# Patient Record
Sex: Male | Born: 1975 | Race: White | Hispanic: No | Marital: Single | Smoking: Never smoker
Health system: Southern US, Community
[De-identification: ages and names within clinical notes are randomized; demographics above are authoritative.]

---

## 2016-10-18 ENCOUNTER — Encounter (HOSPITAL_BASED_OUTPATIENT_CLINIC_OR_DEPARTMENT_OTHER): Payer: Self-pay | Admitting: *Deleted

## 2016-10-18 ENCOUNTER — Emergency Department (HOSPITAL_BASED_OUTPATIENT_CLINIC_OR_DEPARTMENT_OTHER)
Admission: EM | Admit: 2016-10-18 | Discharge: 2016-10-18 | Disposition: A | Discharge status: Home / Self Care | Payer: BLUE CROSS/BLUE SHIELD | Attending: Emergency Medicine | Admitting: Emergency Medicine

## 2016-10-18 ENCOUNTER — Emergency Department (HOSPITAL_BASED_OUTPATIENT_CLINIC_OR_DEPARTMENT_OTHER): Payer: BLUE CROSS/BLUE SHIELD

## 2016-10-18 DIAGNOSIS — Y999 Unspecified external cause status: Secondary | ICD-10-CM | POA: Diagnosis not present

## 2016-10-18 DIAGNOSIS — S60012A Contusion of left thumb without damage to nail, initial encounter: Secondary | ICD-10-CM | POA: Insufficient documentation

## 2016-10-18 DIAGNOSIS — F17290 Nicotine dependence, other tobacco product, uncomplicated: Secondary | ICD-10-CM | POA: Diagnosis not present

## 2016-10-18 DIAGNOSIS — Z23 Encounter for immunization: Secondary | ICD-10-CM | POA: Diagnosis not present

## 2016-10-18 DIAGNOSIS — Y939 Activity, unspecified: Secondary | ICD-10-CM | POA: Insufficient documentation

## 2016-10-18 DIAGNOSIS — S61012A Laceration without foreign body of left thumb without damage to nail, initial encounter: Secondary | ICD-10-CM | POA: Diagnosis not present

## 2016-10-18 DIAGNOSIS — W231XXA Caught, crushed, jammed, or pinched between stationary objects, initial encounter: Secondary | ICD-10-CM | POA: Diagnosis not present

## 2016-10-18 DIAGNOSIS — Y929 Unspecified place or not applicable: Secondary | ICD-10-CM | POA: Insufficient documentation

## 2016-10-18 DIAGNOSIS — S6992XA Unspecified injury of left wrist, hand and finger(s), initial encounter: Secondary | ICD-10-CM

## 2016-10-18 MED ORDER — TETANUS-DIPHTH-ACELL PERTUSSIS 5-2.5-18.5 LF-MCG/0.5 IM SUSP
0.5000 mL | Freq: Once | INTRAMUSCULAR | Status: AC
  Administered 2016-10-18: 0.5 mL via INTRAMUSCULAR
  Filled 2016-10-18: qty 0.5

## 2016-10-18 NOTE — ED Provider Notes (Addendum)
[facility] EMERGENCY DEPARTMENT Provider Note   CSN: [id number] Arrival date & time: 10/18/16  0827     History   Chief Complaint Chief Complaint  Patient presents with  . Finger Injury    HPI Seth Peters is a 41 y.o. male.  The history is provided by the patient.  Hand Pain  This is a new problem. The current episode started 1 to 2 hours ago. The problem occurs constantly. The problem has not changed since onset.Pertinent negatives include no chest pain, no abdominal pain, no headaches and no shortness of breath. Exacerbated by: palpation. Nothing relieves the symptoms. He has tried nothing for the symptoms.   Patient reports that his left thumb was rushed by a spring-loaded jack on a trailer. Endorsing distal thumb pain. Injury resulted in laceration to the DIP crease and small laceration on the dorsum of the left thumb. Unsure of his last tetanus. No other injuries.  History reviewed. No pertinent past medical history.  There are no active problems to display for this patient.   History reviewed. No pertinent surgical history.     Home Medications    Prior to Admission medications   Not on File    Family History History reviewed. No pertinent family history.  Social History Social History  Substance Use Topics  . Smoking status: Never Smoker  . Smokeless tobacco: Current User  . Alcohol use Not on file     Allergies   Patient has no known allergies.   Review of Systems Review of Systems  Respiratory: Negative for shortness of breath.   Cardiovascular: Negative for chest pain.  Gastrointestinal: Negative for abdominal pain.  Neurological: Negative for headaches.     Physical Exam Updated Vital Signs BP (!) 135/98 (BP Location: Right Arm)   Pulse 82   Temp 98.2 F (36.8 C) (Oral)   Resp 16   Ht 5\' 11"  (1.803 m)   Wt 117.9 kg (260 lb)   SpO2 98%   BMI 36.26 kg/m   Physical Exam  Constitutional: He is oriented to person,  place, and time. He appears well-developed and well-nourished. No distress.  HENT:  Head: Normocephalic and atraumatic.  Right Ear: External ear normal.  Left Ear: External ear normal.  Nose: Nose normal.  Mouth/Throat: Mucous membranes are normal. No trismus in the jaw.  Eyes: Conjunctivae and EOM are normal. No scleral icterus.  Neck: Normal range of motion and phonation normal.  Cardiovascular: Normal rate and regular rhythm.   Pulmonary/Chest: Effort normal. No stridor. No respiratory distress.  Abdominal: He exhibits no distension.  Musculoskeletal: Normal range of motion. He exhibits no edema.       Left hand: He exhibits tenderness and laceration. He exhibits normal range of motion, no bony tenderness and no deformity. Normal sensation noted. Normal strength noted.       Hands: NVI distally  Neurological: He is alert and oriented to person, place, and time.  Skin: He is not diaphoretic.  Psychiatric: He has a normal mood and affect. His behavior is normal.  Vitals reviewed.    ED Treatments / Results  Labs (all labs ordered are listed, but only abnormal results are displayed) Labs Reviewed - No data to display  EKG  EKG Interpretation None       Radiology Dg Hand Complete Left  Result Date: 10/18/2016 CLINICAL DATA:  Crush injury.  Open wound at first MCP area EXAM: LEFT HAND - COMPLETE 3+ VIEW COMPARISON:  None. FINDINGS: There is  no evidence of fracture or dislocation. There is no evidence of arthropathy or other focal bone abnormality. Soft tissues are unremarkable. IMPRESSION: Negative. Electronically Signed   By: Charlett Nose M.D.   On: 10/18/2016 09:16    Procedures Procedures (including critical care time)  Medications Ordered in ED Medications  Tdap (BOOSTRIX) injection 0.5 mL (0.5 mLs Intramuscular Given 10/18/16 0915)     Initial Impression / Assessment and Plan / ED Course  I have reviewed the triage vital signs and the nursing notes.  Pertinent  labs & imaging results that were available during my care of the patient were reviewed by me and considered in my medical decision making (see chart for details).     Crush injury to the left thumb resulting in a skin avulsion of the DIP crease and small laceration on the dorsum of the finger. Possible finger pulp hematoma without evidence of compartment syndrome at this time. No subungual hematoma. Plain film w/o acute fracture. Wound thoroughly irrigated. Apply dressing and finger splint. No need for primary closure at this time. Tetanus booster updated.   The patient is safe for discharge with strict return precautions.    Final Clinical Impressions(s) / ED Diagnoses   Final diagnoses:  Injury of finger of left hand, initial encounter  Contusion of left thumb without damage to nail, initial encounter  Laceration of left thumb without foreign body without damage to nail, initial encounter   Disposition: Discharge  Condition: Good  I have discussed the results, Dx and Tx plan with the patient who expressed understanding and agree(s) with the plan. Discharge instructions discussed at great length. The patient was given strict return precautions who verbalized understanding of the instructions. No further questions at time of discharge.    New Prescriptions   No medications on file    Follow Up: [facility] [place] [facility] [address] [id number] [address] [place] [address] [phone]  if pain in the thumb pulp worsens or you notice a pale thumb tip.      Nira Conn, MD 10/18/16 [phone]

## 2016-10-18 NOTE — ED Triage Notes (Signed)
Pt reports crushing left thumb with a jack between metal this am. Laceration noted to thumb, rates pain at 8/10.

## 2017-10-01 ENCOUNTER — Other Ambulatory Visit: Payer: Self-pay | Admitting: Family

## 2017-10-01 DIAGNOSIS — K760 Fatty (change of) liver, not elsewhere classified: Secondary | ICD-10-CM

## 2017-10-11 ENCOUNTER — Inpatient Hospital Stay
Admission: RE | Admit: 2017-10-11 | Discharge: 2017-10-11 | Disposition: A | Discharge status: Home / Self Care | Payer: BLUE CROSS/BLUE SHIELD | Source: Ambulatory Visit | Attending: Family | Admitting: Family

## 2018-12-17 IMAGING — DX DG HAND COMPLETE 3+V*L*
3 series · 3 of 3 positions shown · non-contrast
Comparison: None.

CLINICAL DATA: Crush injury.  Open wound at first MCP area

EXAM:
LEFT HAND - COMPLETE 3+ VIEW

[hand pa]
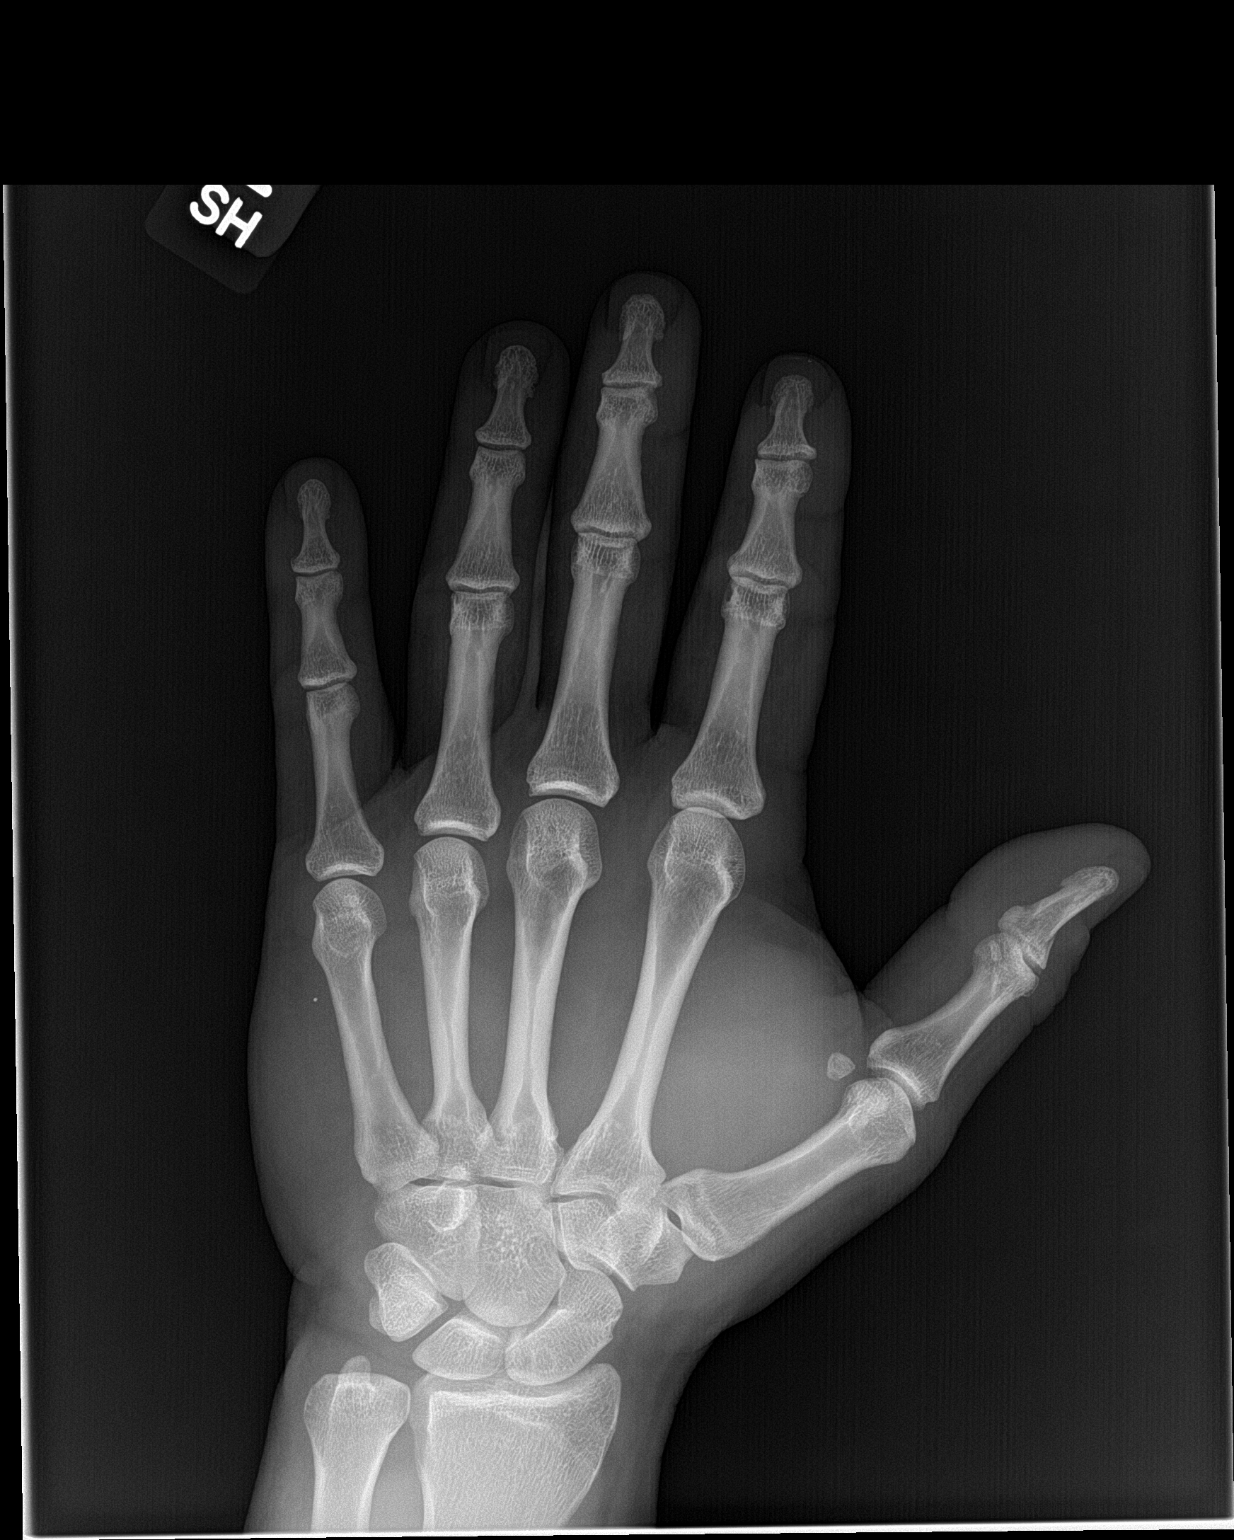

[hand obl]
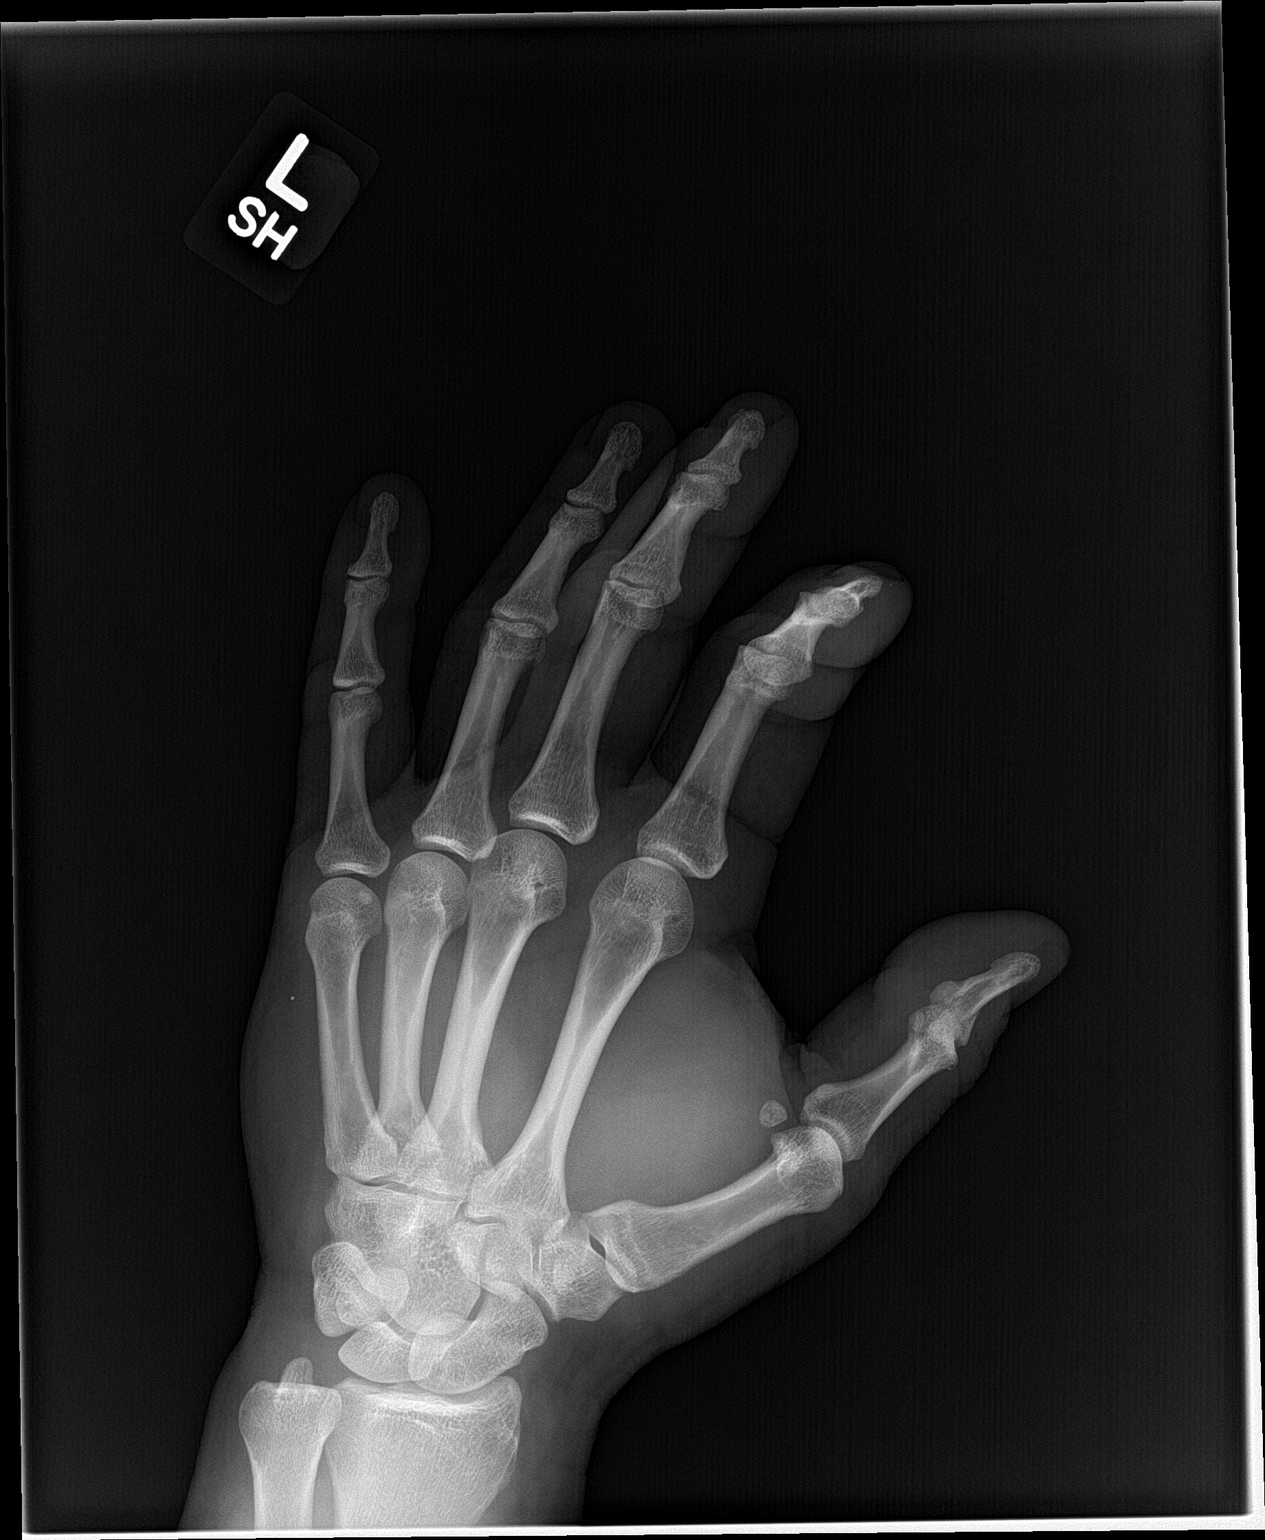

[hand lat]
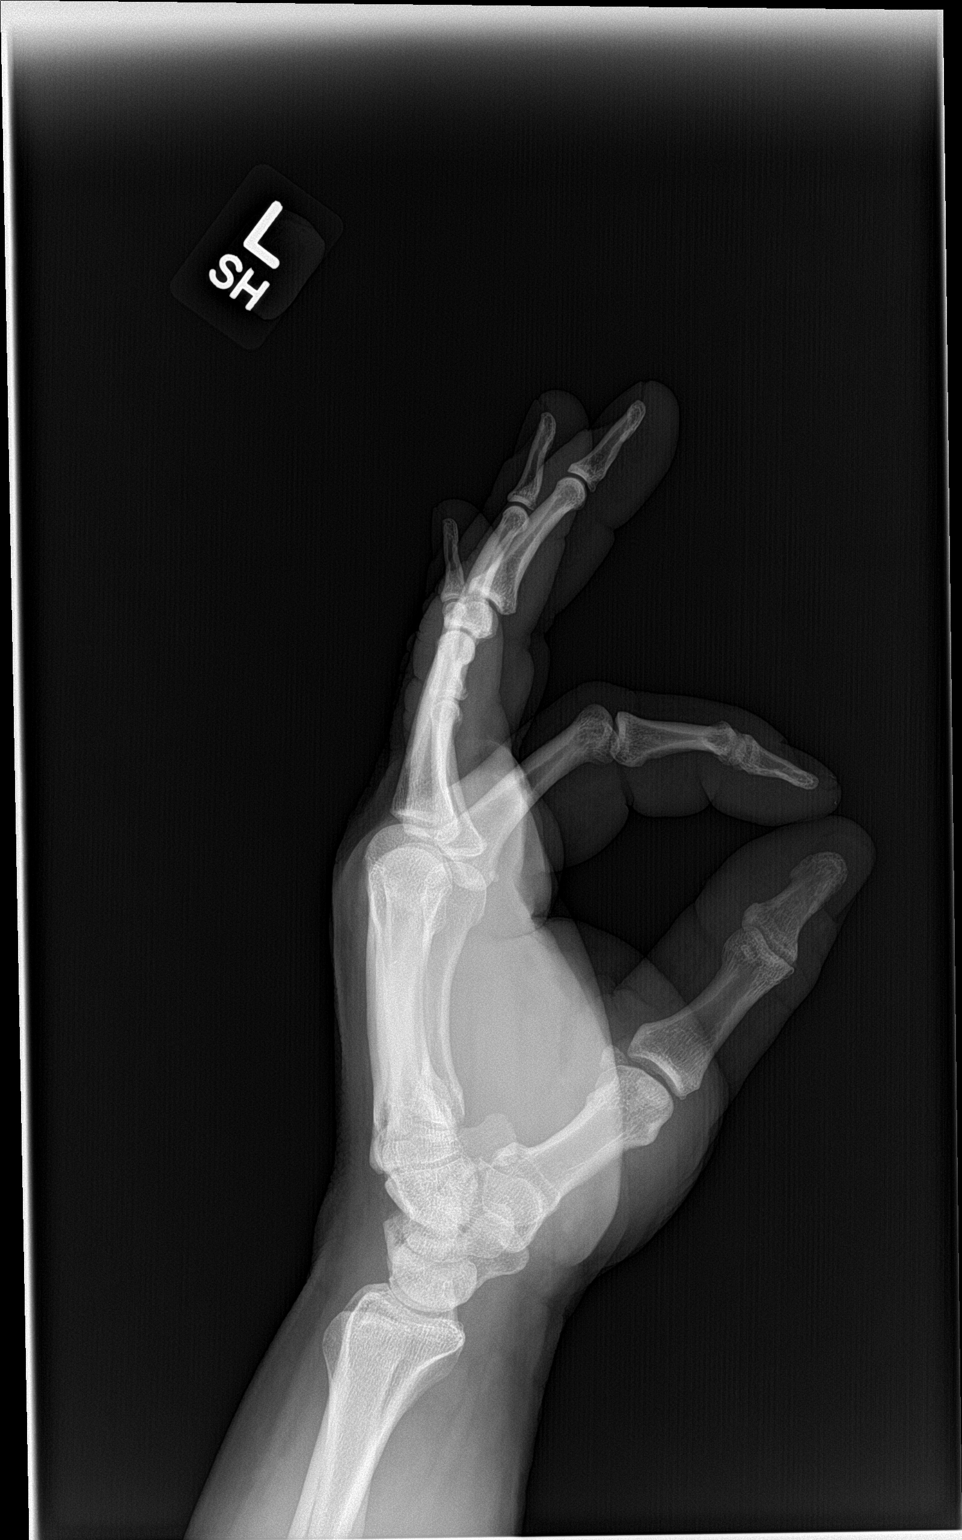

[3 of 3 positions shown; findings below may reference images not displayed]

FINDINGS: There is no evidence of fracture or dislocation. There is no
evidence of arthropathy or other focal bone abnormality. Soft
tissues are unremarkable.
IMPRESSION: Negative.
